# Patient Record
Sex: Female | Born: 1970 | Hispanic: Refuse to answer | Marital: Single | Smoking: Never smoker
Health system: Southern US, Community
[De-identification: ages and names within clinical notes are randomized; demographics above are authoritative.]

## PROBLEM LIST (undated history)

## (undated) DIAGNOSIS — Z853 Personal history of malignant neoplasm of breast: Secondary | ICD-10-CM

## (undated) HISTORY — PX: BREAST LUMPECTOMY WITH SENTINEL LYMPH NODE BIOPSY: SHX5597

## (undated) HISTORY — DX: Personal history of malignant neoplasm of breast: Z85.3

---

## 2019-05-02 ENCOUNTER — Other Ambulatory Visit: Payer: Self-pay | Admitting: Radiology

## 2019-05-03 ENCOUNTER — Telehealth: Payer: Self-pay | Admitting: Hematology and Oncology

## 2019-05-03 NOTE — Telephone Encounter (Signed)
Spoke with patient to confirm morning BC appointment on 1/6, packet will be emailed to patient

## 2019-05-08 ENCOUNTER — Other Ambulatory Visit: Payer: Self-pay | Admitting: *Deleted

## 2019-05-08 ENCOUNTER — Encounter: Payer: Self-pay | Admitting: *Deleted

## 2019-05-08 DIAGNOSIS — C50412 Malignant neoplasm of upper-outer quadrant of left female breast: Secondary | ICD-10-CM | POA: Insufficient documentation

## 2019-05-08 NOTE — Progress Notes (Signed)
[facility] CONSULT NOTE  Patient Care Team: Patient, No Pcp Per as PCP - General (General Practice) Erroll Luna, MD as Consulting Physician (General Surgery) Nicholas Lose, MD as Consulting Physician (Hematology and Oncology) Kyung Rudd, MD as Consulting Physician (Radiation Oncology) Mauro Kaufmann, RN as Oncology Nurse Navigator Rockwell Germany, RN as Oncology Nurse Navigator  CHIEF COMPLAINTS/PURPOSE OF CONSULTATION:  Newly diagnosed breast cancer  HISTORY OF PRESENTING ILLNESS:  Alexa Cook 49 y.o. female is here because of recent diagnosis of recurrent left breast cancer. She has a personal history of left breast cancer in 2015 for which she underwent a lumpectomy and treatment in [place]. She palpated a lump at the left breast lumpectomy site for several months. Diagnostic mammogram on 04/24/19 showed a 1.4cm mass at the lumpectomy site, a 0.6cm mass at the 6 o'clock position in the left breast, and one abnormal left axillary lymph node. She presents to the clinic today for initial evaluation and discussion of treatment options.   I reviewed her records extensively and collaborated the history with the patient.  SUMMARY OF ONCOLOGIC HISTORY: Oncology History  Malignant neoplasm of upper-outer quadrant of left female breast ([place])  2015 Initial Diagnosis   Left breast cancer s/p lumpectomy and treatment in [place] in 2015 (Unknown stage) ER/PR Pos, Received 14 days of XRT (stopped by her) Refused anti-estrogens.   04/2019 Relapse/Recurrence   Rash and nodule started Aug 2020, Imaging revealed fat necrosis and a mass extending to the skin , satellite nodule 0.6 cm, Enlarged LN 1.1 cm, Ultrasound 2.5 cm Biopsy revealed Grade 3 IDC, Prog panel: Pending     MEDICAL HISTORY:  Past Medical History:  Diagnosis Date  . HX: breast cancer     SURGICAL HISTORY: Past Surgical History:  Procedure Laterality Date  . BREAST LUMPECTOMY WITH SENTINEL  LYMPH NODE BIOPSY Left     SOCIAL HISTORY: Social History   Socioeconomic History  . Marital status: Unknown    Spouse name: Not on file  . Number of children: Not on file  . Years of education: Not on file  . Highest education level: Not on file  Occupational History  . Not on file  Tobacco Use  . Smoking status: Never Smoker  . Smokeless tobacco: Never Used  Substance and Sexual Activity  . Alcohol use: Not Currently  . Drug use: Never  . Sexual activity: Not on file  Other Topics Concern  . Not on file  Social History Narrative  . Not on file   Social Determinants of Health   Financial Resource Strain:   . Difficulty of Paying Living Expenses: Not on file  Food Insecurity:   . Worried About Charity fundraiser in the Last Year: Not on file  . Ran Out of Food in the Last Year: Not on file  Transportation Needs:   . Lack of Transportation (Medical): Not on file  . Lack of Transportation (Non-Medical): Not on file  Physical Activity:   . Days of Exercise per Week: Not on file  . Minutes of Exercise per Session: Not on file  Stress:   . Feeling of Stress : Not on file  Social Connections:   . Frequency of Communication with Friends and Family: Not on file  . Frequency of Social Gatherings with Friends and Family: Not on file  . Attends Religious Services: Not on file  . Active Member of Clubs or Organizations: Not on file  . Attends Archivist Meetings:  Not on file  . Marital Status: Not on file  Intimate Partner Violence:   . Fear of Current or Ex-Partner: Not on file  . Emotionally Abused: Not on file  . Physically Abused: Not on file  . Sexually Abused: Not on file    FAMILY HISTORY: Family History  Problem Relation Age of Onset  . Lung cancer Maternal Grandfather   . Cancer Paternal Grandmother        unk type    ALLERGIES:  has No Known Allergies.  MEDICATIONS:  Current Outpatient Medications  Medication Sig Dispense Refill  .  acyclovir (ZOVIRAX) 400 MG tablet Take 400 mg by mouth daily.     No current facility-administered medications for this visit.    REVIEW OF SYSTEMS:   Constitutional: Denies fevers, chills or abnormal night sweats Eyes: Denies blurriness of vision, double vision or watery eyes Ears, nose, mouth, throat, and face: Denies mucositis or sore throat Respiratory: Denies cough, dyspnea or wheezes Cardiovascular: Denies palpitation, chest discomfort or lower extremity swelling Gastrointestinal:  Denies nausea, heartburn or change in bowel habits Skin: Denies abnormal skin rashes Lymphatics: Denies new lymphadenopathy or easy bruising Neurological:Denies numbness, tingling or new weaknesses Behavioral/Psych: Mood is stable, no new changes  Breast: Denies any palpable lumps or discharge All other systems were reviewed with the patient and are negative.  PHYSICAL EXAMINATION: ECOG PERFORMANCE STATUS: 1 - Symptomatic but completely ambulatory  Vitals:   05/09/19 0913  BP: 128/65  Pulse: 65  Resp: 18  Temp: (!) 97.4 F (36.3 C)  SpO2: 100%   Filed Weights   05/09/19 0913  Weight: 154 lb 14.4 oz (70.3 kg)    GENERAL:alert, no distress and comfortable SKIN: skin color, texture, turgor are normal, no rashes or significant lesions EYES: normal, conjunctiva are pink and non-injected, sclera clear OROPHARYNX:no exudate, no erythema and lips, buccal mucosa, and tongue normal  NECK: supple, thyroid normal size, non-tender, without nodularity LYMPH:  no palpable lymphadenopathy in the cervical, axillary or inguinal LUNGS: clear to auscultation and percussion with normal breathing effort HEART: regular rate & rhythm and no murmurs and no lower extremity edema ABDOMEN:abdomen soft, non-tender and normal bowel sounds Musculoskeletal:no cyanosis of digits and no clubbing  PSYCH: alert & oriented x 3 with fluent speech NEURO: no focal motor/sensory deficits   LABORATORY DATA:  I have reviewed  the data as listed Lab Results  Component Value Date   WBC 5.2 05/09/2019   HGB 14.8 05/09/2019   HCT 43.8 05/09/2019   MCV 89.6 05/09/2019   PLT 287 05/09/2019   Lab Results  Component Value Date   NA 142 05/09/2019   K 4.2 05/09/2019   CL 105 05/09/2019   CO2 26 05/09/2019    RADIOGRAPHIC STUDIES: I have personally reviewed the radiological reports and agreed with the findings in the report.  ASSESSMENT AND PLAN:  Malignant neoplasm of upper-outer quadrant of left female breast [facility]) 2015: [place] Left breast cancer s/p lumpectomy and treatment in [place] in 2015 (Unknown stage) ER/PR Pos, Received 14 days of XRT (stopped by her) Refused anti-estrogens.  04/2019: Rash and nodule started Aug 2020, Imaging revealed fat necrosis and a mass extending to the skin , satellite nodule 0.6 cm, Enlarged LN 1.1 cm, Ultrasound 2.5 cm Biopsy revealed Grade 3 IDC, Prog panel: Pending  Emotional Distress: Patient has major depression and her main support had been her friend who died (suicide). Shes in severe depression because of this recurrence. She has major distrust for  medical personal based on her experience in [place].  She is currently on Xanax and also uses cannabis frequently.  Plan: 1. CT CAP 2. Patient may need Mastectomy and ALND but shes not completely agreeable to this yet. 3. Patient may need chemo but she doesn't even want to consider 4. If shes ER positive, She will need anti-estrogen therapy (She doesn't want to take that either)  I offered counseling and support and will see her post scan to discuss the pending results and scan results Dr. Brantley Stage will see the patient after the scans so that he can discuss surgical options for her.   All questions were answered. The patient knows to call the clinic with any problems, questions or concerns.   Rulon Eisenmenger, MD, MPH 05/09/2019    I, Molly Dorshimer, am acting as scribe for Nicholas Lose, MD.  I have reviewed  the above documentation for accuracy and completeness, and I agree with the above.

## 2019-05-09 ENCOUNTER — Ambulatory Visit: Payer: 59 | Attending: Surgery | Admitting: Physical Therapy

## 2019-05-09 ENCOUNTER — Other Ambulatory Visit: Payer: Self-pay | Admitting: *Deleted

## 2019-05-09 ENCOUNTER — Inpatient Hospital Stay: Payer: 59

## 2019-05-09 ENCOUNTER — Ambulatory Visit: Payer: 59 | Admitting: Licensed Clinical Social Worker

## 2019-05-09 ENCOUNTER — Encounter: Payer: Self-pay | Admitting: *Deleted

## 2019-05-09 ENCOUNTER — Inpatient Hospital Stay (HOSPITAL_BASED_OUTPATIENT_CLINIC_OR_DEPARTMENT_OTHER): Payer: 59 | Admitting: Hematology and Oncology

## 2019-05-09 ENCOUNTER — Encounter: Payer: Self-pay | Admitting: Licensed Clinical Social Worker

## 2019-05-09 ENCOUNTER — Other Ambulatory Visit: Payer: Self-pay

## 2019-05-09 ENCOUNTER — Encounter: Payer: Self-pay | Admitting: Hematology and Oncology

## 2019-05-09 ENCOUNTER — Encounter: Payer: Self-pay | Admitting: Physical Therapy

## 2019-05-09 ENCOUNTER — Telehealth: Payer: Self-pay | Admitting: *Deleted

## 2019-05-09 ENCOUNTER — Ambulatory Visit
Admission: RE | Admit: 2019-05-09 | Discharge: 2019-05-09 | Disposition: A | Discharge status: Home / Self Care | Payer: 59 | Source: Ambulatory Visit | Attending: Radiation Oncology | Admitting: Radiation Oncology

## 2019-05-09 DIAGNOSIS — R293 Abnormal posture: Secondary | ICD-10-CM | POA: Diagnosis present

## 2019-05-09 DIAGNOSIS — M25612 Stiffness of left shoulder, not elsewhere classified: Secondary | ICD-10-CM | POA: Diagnosis present

## 2019-05-09 DIAGNOSIS — Z853 Personal history of malignant neoplasm of breast: Secondary | ICD-10-CM

## 2019-05-09 DIAGNOSIS — Z809 Family history of malignant neoplasm, unspecified: Secondary | ICD-10-CM | POA: Insufficient documentation

## 2019-05-09 DIAGNOSIS — C50412 Malignant neoplasm of upper-outer quadrant of left female breast: Secondary | ICD-10-CM

## 2019-05-09 DIAGNOSIS — Z170 Estrogen receptor positive status [ER+]: Secondary | ICD-10-CM | POA: Insufficient documentation

## 2019-05-09 DIAGNOSIS — Z79899 Other long term (current) drug therapy: Secondary | ICD-10-CM | POA: Insufficient documentation

## 2019-05-09 DIAGNOSIS — Z801 Family history of malignant neoplasm of trachea, bronchus and lung: Secondary | ICD-10-CM | POA: Insufficient documentation

## 2019-05-09 DIAGNOSIS — F1290 Cannabis use, unspecified, uncomplicated: Secondary | ICD-10-CM | POA: Insufficient documentation

## 2019-05-09 DIAGNOSIS — F329 Major depressive disorder, single episode, unspecified: Secondary | ICD-10-CM

## 2019-05-09 LAB — CBC WITH DIFFERENTIAL (CANCER CENTER ONLY)
Abs Immature Granulocytes: 0.01 10*3/uL (ref 0.00–0.07)
Basophils Absolute: 0.1 10*3/uL (ref 0.0–0.1)
Basophils Relative: 1 %
Eosinophils Absolute: 0.5 10*3/uL (ref 0.0–0.5)
Eosinophils Relative: 10 %
HCT: 43.8 % (ref 36.0–46.0)
Hemoglobin: 14.8 g/dL (ref 12.0–15.0)
Immature Granulocytes: 0 %
Lymphocytes Relative: 27 %
Lymphs Abs: 1.4 10*3/uL (ref 0.7–4.0)
MCH: 30.3 pg (ref 26.0–34.0)
MCHC: 33.8 g/dL (ref 30.0–36.0)
MCV: 89.6 fL (ref 80.0–100.0)
Monocytes Absolute: 0.4 10*3/uL (ref 0.1–1.0)
Monocytes Relative: 8 %
Neutro Abs: 2.8 10*3/uL (ref 1.7–7.7)
Neutrophils Relative %: 54 %
Platelet Count: 287 10*3/uL (ref 150–400)
RBC: 4.89 MIL/uL (ref 3.87–5.11)
RDW: 12.5 % (ref 11.5–15.5)
WBC Count: 5.2 10*3/uL (ref 4.0–10.5)
nRBC: 0 % (ref 0.0–0.2)

## 2019-05-09 LAB — CMP (CANCER CENTER ONLY)
ALT: 14 U/L (ref 0–44)
AST: 14 U/L — ABNORMAL LOW (ref 15–41)
Albumin: 4.4 g/dL (ref 3.5–5.0)
Alkaline Phosphatase: 53 U/L (ref 38–126)
Anion gap: 11 (ref 5–15)
BUN: 11 mg/dL (ref 6–20)
CO2: 26 mmol/L (ref 22–32)
Calcium: 9.1 mg/dL (ref 8.9–10.3)
Chloride: 105 mmol/L (ref 98–111)
Creatinine: 0.71 mg/dL (ref 0.44–1.00)
GFR, Est AFR Am: >60 mL/min (ref >60)
GFR, Estimated: >60 mL/min (ref >60)
Glucose, Bld: 109 mg/dL — ABNORMAL HIGH (ref 70–99)
Potassium: 4.2 mmol/L (ref 3.5–5.1)
Sodium: 142 mmol/L (ref 135–145)
Total Bilirubin: 0.5 mg/dL (ref 0.3–1.2)
Total Protein: 7.6 g/dL (ref 6.5–8.1)

## 2019-05-09 LAB — GENETIC SCREENING ORDER

## 2019-05-09 NOTE — Telephone Encounter (Signed)
Called pt and discussed ER/PR+, Ki67 and Her2 status. Informed pt that I was able to move CT scan up to 1/8 at 10:15am at [facility]. Received verbal understanding. Denies further needs or questions at this time.

## 2019-05-09 NOTE — Progress Notes (Signed)
REFERRING PROVIDER: Nicholas Lose, MD [place],  [place] [address]  PRIMARY PROVIDER:  Patient, No Pcp Per  I connected with Ms. Reaver on 05/09/2019 at 11:30 AM EDT by Webex and verified that I am speaking with the correct person using two identifiers.    Patient location: [facility] Provider location: office  HISTORY OF PRESENT ILLNESS:   Ms. Massar, a 49 y.o. female, was seen for a [place] cancer genetics consultation at the request of Dr. Lindi Adie due to a personal history of cancer.  Ms. Mcmillen presents to clinic today to discuss the possibility of a hereditary predisposition to cancer, genetic testing, and to further clarify her future cancer risks, as well as potential cancer risks for family members.   In 2015, at  the age of 41, Ms. Dimascio was diagnosed with left breast cancer. This was treated with lumpectomy and radiation.  In 2020, at the age of 81, Ms. 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 The treatment plan includes surgery, chemotherapy and radiation.  CANCER HISTORY:  Oncology History  Malignant neoplasm of upper-outer quadrant of left female breast ([place])  2015 Initial Diagnosis   Left breast cancer s/p lumpectomy and treatment in [place] in 2015      Past Medical History:  Diagnosis Date  . HX: breast cancer     Past Surgical History:  Procedure Laterality Date  . BREAST LUMPECTOMY WITH SENTINEL LYMPH NODE BIOPSY Left     Social History   Socioeconomic History  . Marital status: Unknown    Spouse name: Not on file  . Number of children: Not on file  . Years of education: Not on file  . Highest education level: Not on file  Occupational History  . Not on file  Tobacco Use  . Smoking status: Never Smoker  . Smokeless tobacco: Never Used  Substance and Sexual Activity  . Alcohol use: Not Currently  . Drug use: Never  . Sexual activity: Not on file  Other Topics  Concern  . Not on file  Social History Narrative  . Not on file   Social Determinants of Health   Financial Resource Strain:   . Difficulty of Paying Living Expenses: Not on file  Food Insecurity:   . Worried About Charity fundraiser in the Last Year: Not on file  . Ran Out of Food in the Last Year: Not on file  Transportation Needs:   . Lack of Transportation (Medical): Not on file  . Lack of Transportation (Non-Medical): Not on file  Physical Activity:   . Days of Exercise per Week: Not on file  . Minutes of Exercise per Session: Not on file  Stress:   . Feeling of Stress : Not on file  Social Connections:   . Frequency of Communication with Friends and Family: Not on file  . Frequency of Social Gatherings with Friends and Family: Not on file  . Attends Religious Services: Not on file  . Active Member of Clubs or Organizations: Not on file  . Attends Archivist Meetings: Not on file  . Marital Status: Not on file     FAMILY HISTORY:  We obtained a detailed, 4-generation family history.  Significant diagnoses are listed below: Family History  Problem Relation Age of Onset  . Lung cancer Maternal Grandfather   . Cancer Paternal Grandmother        unk type   Ms. Faeth has 2 sons, no  cancers. She does not have brothers or sisters.  Ms. Jenning mother has no history of cancer. Patient has 1 maternal aunt and 3 maternal uncles, no cancers. Patient is unsure if any maternal cousins have had cancer. Maternal grandfather had lung cancer, maternal grandmother had no history of cancer.  Ms. Schlemmer father has no history of cancer. No cancer history for her 4 paternal aunts/uncles, and no known cancers in paternal cousins. Paternal grandmother had cancer but patient is unsure the type. Paternal grandfather did not have cancer.  Ms. Sawdey is unaware of previous family history of genetic testing for hereditary cancer risks. There is no reported Ashkenazi  Jewish ancestry. There is no known consanguinity.  GENETIC COUNSELING ASSESSMENT: Ms. Dresden is a 49 y.o. female with a personal history which is somewhat suggestive of a hereditary cancer syndrome and predisposition to cancer. We, therefore, discussed and recommended the following at today's visit.   DISCUSSION: We discussed that 5 - 10% of breast cancer is hereditary, with most cases associated with BRCA1/BRCA2 mutations.  There are other genes that can be associated with hereditary breast cancer syndromes.  We discussed that testing is beneficial for several reasons including surgical decision-making for breast cancer, knowing how to follow individuals after completing their treatment, and understand if other family members could be at risk for cancer and allow them to undergo genetic testing.   We reviewed the characteristics, features and inheritance patterns of hereditary cancer syndromes. We also discussed genetic testing, including the appropriate family members to test, the process of testing, insurance coverage and turn-around-time for results. We discussed the implications of a negative, positive and/or variant of uncertain significant result. In order to get genetic test results in a timely manner so that Ms. Canion can use these genetic test results for surgical decisions, we recommended Ms. Walla pursue genetic testing for the Breast Cancer STAT Panel. Once complete, we recommend Ms. Witt pursue reflex genetic testing to the Common Hereditary Cancers  gene panel.   The STAT Breast cancer panel offered by [facility] includes sequencing and rearrangement analysis for the following 9 genes:  ATM, BRCA1, BRCA2, CDH1, CHEK2, PALB2, PTEN, STK11 and TP53.    The Common Hereditary Cancers Panel offered by [facility] includes sequencing and/or deletion duplication testing of the following 48 genes: APC, ATM, AXIN2, BARD1, BMPR1A, BRCA1, BRCA2, BRIP1, CDH1, CDKN2A (p14ARF), CDKN2A (p16INK4a),  CKD4, CHEK2, CTNNA1, DICER1, EPCAM (Deletion/duplication testing only), GREM1 (promoter region deletion/duplication testing only), KIT, MEN1, MLH1, MSH2, MSH3, MSH6, MUTYH, NBN, NF1, NHTL1, PALB2, PDGFRA, PMS2, POLD1, POLE, PTEN, RAD50, RAD51C, RAD51D, RNF43, SDHB, SDHC, SDHD, SMAD4, SMARCA4. STK11, TP53, TSC1, TSC2, and VHL.  The following genes were evaluated for sequence changes only: SDHA and HOXB13 c.251G>A variant only.  Based on Ms. Ramaswamy's personal history of cancer, she meets medical criteria for genetic testing. Despite that she meets criteria, she may still have an out of pocket cost.   PLAN: After considering the risks, benefits, and limitations, Ms. Disbrow provided informed consent to pursue genetic testing and the blood sample was sent to [facility] for analysis of the Breast Cancer STAT Panel + Common Hereditary Cancers Panel. Results should be available within approximately 1 weeks' time, at which point they will be disclosed by telephone to Ms. Hardie, as will any additional recommendations warranted by these results. Ms. Wismer will receive a summary of her genetic counseling visit and a copy of her results once available. This information will also be available in Epic.   Ms.  Berkemeier's questions were answered to her satisfaction today. Our contact information was provided should additional questions or concerns arise. Thank you for the referral and allowing [place] to share in the care of your patient.   Faith Rogue, MS, [facility] Genetic Counselor [email or web address] Phone: [phone]  The patient was seen for a total of 12 minutes in virtual  genetic counseling. Patient's mother was also present.  Drs. Magrinat, Lindi Adie and/or Burr Medico were available for discussion regarding this case.   _______________________________________________________________________ For Office Staff:  Number of people involved in session: 2 Was an Intern/ student involved with  case: no

## 2019-05-09 NOTE — Assessment & Plan Note (Signed)
2015: [place] Left breast cancer s/p lumpectomy and treatment in [place] in 2015 (Unknown stage) ER/PR Pos, Received 14 days of XRT (stopped by her) Refused anti-estrogens.  04/2019: Rash and nodule started Aug 2020, Imaging revealed fat necrosis and a mass extending to the skin , satellite nodule 0.6 cm, Enlarged LN 1.1 cm, Ultrasound 2.5 cm Biopsy revealed Grade 3 IDC, Prog panel: Pending  Emotional Distress: Patient has major depression and her main support had been her friend who died (suicide). Shes in severe depression because of this recurrence. She has major distrust for medical personal.  Plan: 1. CT CAP 2. Patient may need Mastectomy and ALND but shes not completely agreeable to this 3. Patient may need chemo but she doesn't even want to consider 4. If shes ER positive, She will ne anti-estrogen therapy (She doesn't want to take that either)  I offered counseling and support and will see her post scan to discuss the pending results and scan results

## 2019-05-09 NOTE — Addendum Note (Signed)
Addended by: Arbutus Ped C on: 05/09/2019 11:42 AM   Modules accepted: Orders

## 2019-05-09 NOTE — Progress Notes (Signed)
Clinical Social Work [facility] Psychosocial Distress Screening [place]  Patient completed distress screening protocol and scored a 10 on the Psychosocial Distress Thermometer which indicates severe distress. Clinical Social Worker met with patient and patients family member in [facility] to assess for distress and other psychosocial needs. Patient stated she was feeling overwhelmed and frustrated due to lack of information on her treatment plan. CSW and patient discussed common feeling and emotions when being diagnosed with cancer, and the importance of support during treatment. CSW informed patient of the support team and support services at [facility].  Patient was appreciative of information, but was not interested in exploring additional support at this time.  Patient was agreeable to CSW follow up at a later date. CSW provided contact information and encouraged patient to call with any questions or concerns.  ONCBCN DISTRESS SCREENING 05/09/2019  Screening Type Initial Screening  Distress experienced in past week (1-10) 10  Practical problem type Housing;Insurance;Work/school  Emotional problem type Depression;Nervousness/Anxiety;Adjusting to illness;Isolation/feeling alone;Feeling hopeless;Boredom;Adjusting to appearance changes  Spiritual/Religous concerns type Loss of Faith;Facing my mortality;Loss of sense of purpose  Information Concerns Type Lack of info about diagnosis;Lack of info about treatment  Physical Problem type Pain;Sleep/insomnia;Loss of appetitie;Skin dry/itchy  Physician notified of physical symptoms Yes     Johnnye Lana, MSW, LCSW, OSW-C Clinical Social Worker [facility] [phone]

## 2019-05-09 NOTE — Patient Instructions (Signed)

## 2019-05-09 NOTE — Therapy (Signed)
[place], [place], [address] Phone: [phone]   Fax:  [phone]  Physical Therapy Evaluation  Patient Details  Name: Alexa Cook MRN: [id number] Date of Birth: Nov 08, 1970 Referring Provider (PT): Dr. Erroll Luna   Encounter Date: 05/09/2019  PT End of Session - 05/09/19 1058    Visit Number  1    Number of Visits  2    Date for PT Re-Evaluation  07/04/19    PT Start Time  0912    PT Stop Time  0938    PT Time Calculation (min)  26 min    Activity Tolerance  Patient tolerated treatment well    Behavior During Therapy  [facility] for tasks assessed/performed       Past Medical History:  Diagnosis Date  . HX: breast cancer     Past Surgical History:  Procedure Laterality Date  . BREAST LUMPECTOMY WITH SENTINEL LYMPH NODE BIOPSY Left     There were no vitals filed for this visit.   Subjective Assessment - 05/09/19 0954    Subjective  Patient reports she is here today to be seen by her medical team for her newly diagnosed left breast cancer.    Patient is accompained by:  Family member    Pertinent History  Patient was diagnosed on 04/24/2019 with left grade III invasive ductal carcinoma breast cancer. There is a 6 mm mass on her skin and a 2.5 cm mass in her upper outer quadrant. She has a known positive axillary lymph node. In 2015, she underwent a left lumpectomy and radiation for invasive ductal carcinoma. She reports she stopped radiation after 14 treatments and declined anti-estrogen therapy. She reports she did not have any axillary nodes removed in 2015 but we do not have her medical record from [place].    Patient Stated Goals  Reduce lymphedema risk and learn post op shoulder ROM HEP    Currently in Pain?  Yes    Pain Score  7     Pain Location  Neck    Pain Orientation  Right;Left    Pain Descriptors / Indicators  Aching;Numbness    Pain Type  Chronic pain    Pain Radiating  Towards  BUE    Pain Onset  More than a month ago    Pain Frequency  Intermittent    Aggravating Factors   unknown    Pain Relieving Factors  unknown    Multiple Pain Sites  No         [facility] PT Assessment - 05/09/19 0001      Assessment   Medical Diagnosis  Left breast cancer    Referring Provider (PT)  Dr. Marcello Moores [place]    Onset Date/Surgical Date  04/24/19    Hand Dominance  Right    Prior Therapy  none      Precautions   Precautions  Other (comment)    Precaution Comments  active cancer; previous left breast cancer      Restrictions   Weight Bearing Restrictions  No      Balance Screen   Has the patient fallen in the past 6 months  No    Has the patient had a decrease in activity level because of a fear of falling?   No    Is the patient reluctant to leave their home because of a fear of falling?   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[employer]; very physical work    Leisure  She does not exercise      Cognition   Overall Cognitive Status  Within Functional Limits for tasks assessed      Posture/Postural Control   Posture/Postural Control  Postural limitations    Postural Limitations  Rounded Shoulders;Forward head      ROM / Strength   AROM / PROM / Strength  AROM;Strength      AROM   Overall AROM Comments  Cervical AROM is WNL    AROM Assessment Site  Shoulder;Cervical    Right/Left Shoulder  Right;Left    Right Shoulder Extension  38 Degrees    Right Shoulder Flexion  145 Degrees    Right Shoulder ABduction  157 Degrees    Right Shoulder Internal Rotation  62 Degrees    Right Shoulder External Rotation  75 Degrees    Left Shoulder Extension  40 Degrees    Left Shoulder Flexion  122 Degrees    Left  Shoulder ABduction  118 Degrees   painful in shoulder   Left Shoulder Internal Rotation  63 Degrees    Left Shoulder External Rotation  75 Degrees      Strength   Overall Strength  Within functional limits for tasks performed        LYMPHEDEMA/ONCOLOGY QUESTIONNAIRE - 05/09/19 1031      Type   Cancer Type  Left breast cancer      Lymphedema Assessments   Lymphedema Assessments  Upper extremities      Right Upper Extremity Lymphedema   10 cm Proximal to Olecranon Process  27.5 cm    Olecranon Process  25 cm    10 cm Proximal to Ulnar Styloid Process  21.1 cm    Just Proximal to Ulnar Styloid Process  14.7 cm    Across Hand at [employer]  17.7 cm    At [place] of 2nd Digit  6.2 cm      Left Upper Extremity Lymphedema   10 cm Proximal to Olecranon Process  27.6 cm    Olecranon Process  24 cm    10 cm Proximal to Ulnar Styloid Process  20.3 cm    Just Proximal to Ulnar Styloid Process  14.9 cm    Across Hand at [employer]  18.4 cm    At [place] of 2nd Digit  6.3 cm          Quick Dash - 05/09/19 0001    Open a tight or new jar  --    Do heavy household chores (wash walls, wash floors)  --    Carry a shopping bag or briefcase  --    Wash your back  --    Use a knife to cut food  --    Recreational activities in which you take some force or impact through your arm, shoulder, or hand (golf, hammering, tennis)  --    During the past week, to what extent has your arm, shoulder or hand problem interfered with your normal social activities with family, friends, neighbors, or groups?  --    During the past week, to what extent has your arm, shoulder or hand problem limited your work or  other regular daily activities  --    Arm, shoulder, or hand pain.  --    Tingling (pins and needles) in your arm, shoulder, or hand  --    Difficulty Sleeping  --    DASH Score  --        Objective measurements completed on examination: See above findings.       Patient was  instructed today in a home exercise program today for post op shoulder range of motion. These included active assist shoulder flexion in sitting, scapular retraction, wall walking with shoulder abduction, and hands behind head external rotation.  She was encouraged to do these twice a day, holding 3 seconds and repeating 5 times when permitted by her physician.         PT Education - 05/09/19 1032    Education Details  Lymphedema risk reduction and post op shoulder ROM HEP    Person(s) Educated  Patient    Methods  Explanation;Demonstration;Handout    Comprehension  Returned demonstration;Verbalized understanding          PT Long Term Goals - 05/09/19 1106      PT LONG TERM GOAL #1   Title  Patient will demonstrate she has regained full shoulder ROM and function post operatively compared to baselines.    Time  8    Period  Weeks    Target Date  07/04/19      Breast Clinic Goals - 05/09/19 1106      Patient will be able to verbalize understanding of pertinent lymphedema risk reduction practices relevant to her diagnosis specifically related to skin care.   Time  1    Period  Days    Status  Achieved      Patient will be able to return demonstrate and/or verbalize understanding of the post-op home exercise program related to regaining shoulder range of motion.   Time  1    Period  Days    Status  Achieved      Patient will be able to verbalize understanding of the importance of attending the postoperative After Breast Cancer Class for further lymphedema risk reduction education and therapeutic exercise.   Time  1    Period  Days    Status  Achieved            Plan - 05/09/19 1059    Clinical Impression Statement  Patient was diagnosed on 04/24/2019 with left grade III invasive ductal carcinoma breast cancer. There is a 6 mm mass on her skin and a 2.5 cm mass in her upper outer quadrant. She has a known positive axillary lymph node. In 2015, she underwent a left  lumpectomy and radiation for invasive ductal carcinoma. She reports she stopped radiation after 14 treatments and declined anti-estrogen therapy. She reports she did not have any axillary nodes removed in 2015 but we do not have her medical record from [place] yet. Her multidisciplinary medical team met prior to her assessments to determine a recommended plan. The recommendation was a left mastectomy and either sentinel node biopsy or axillary node dissection depending on previous surgery report followed by possible chemotherapy and possible anti-estrogen therapy pending her progrnositc panel results. She reported that she will possibly agree to surgery but will likely decline chemo, radiation, and anti-estrogen therapy. She will benefit from a post op PT reassessment to determine needs.    Stability/Clinical Decision Making  Stable/Uncomplicated    Clinical Decision Making  Low    Rehab Potential  Good   May decline f/u care from PT   PT Treatment/Interventions  ADLs/Self Care Home Management;Therapeutic exercise;Patient/family education    PT Next Visit Plan  Will reassess 3-4 weeks post op if agreeable to f/u care from PT    PT Home Exercise Plan  Post op shoulder ROM HEP    Consulted and Agree with Plan of Care  Patient;Family member/caregiver    Family Member Consulted  Mom       Patient will benefit from skilled therapeutic intervention in order to improve the following deficits and impairments:  Postural dysfunction, Decreased range of motion, Decreased knowledge of precautions, Impaired UE functional use, Pain  Visit Diagnosis: Carcinoma of upper-outer quadrant of left female breast, unspecified estrogen receptor status ([place]) - Plan: PT plan of care cert/re-cert  Abnormal posture - Plan: PT plan of care cert/re-cert  Stiffness of left shoulder, not elsewhere classified - Plan: PT plan of care cert/re-cert   Patient will follow up at outpatient cancer rehab 3-4 weeks following  surgery.  If the patient requires physical therapy at that time, a specific plan will be dictated and sent to the referring physician for approval. The patient was educated today on appropriate basic range of motion exercises to begin post operatively and the importance of attending the After Breast Cancer class following surgery.  Patient was educated today on lymphedema risk reduction practices as it pertains to recommendations that will benefit the patient immediately following surgery.  She verbalized good understanding.      Problem List Patient Active Problem List   Diagnosis Date Noted  . Malignant neoplasm of upper-outer quadrant of left female breast [facility]) 05/08/2019   Annia Friendly, PT 05/09/19 11:23 AM  [place], [place], [address] Phone: [phone]   Fax:  [phone]  Name: Alexa Cook MRN: [id number] Date of Birth: 1970/07/14

## 2019-05-09 NOTE — Progress Notes (Signed)
Radiation Oncology         [phone] ________________________________  Name: Alexa Cook        MRN: [id number]  Date of Service: 05/09/2019 DOB: May 27, 1970  OV:FIEPPIR, No Pcp Per  Erroll Luna, MD     REFERRING PHYSICIAN: Erroll Luna, MD   DIAGNOSIS: The encounter diagnosis was Malignant neoplasm of upper-outer quadrant of left female breast, unspecified estrogen receptor status ([place]).   HISTORY OF PRESENT ILLNESS: Alexa Cook is a 49 y.o. female seen in the multidisciplinary breast clinic for a history of left-sided breast cancer.  The patient was originally diagnosed in 63 when she was living in [place].  It appears that she had a ER/PR positive disease, HER-2/neu was negative, she received a lumpectomy followed by a partial course of radiotherapy.  She states that she declined antiestrogen therapy, and also received 14 fractions of radiotherapy.  She states that she discontinued radiation as she felt like it was not good for her body, she states that she did not have severe side effects however.  She reports that she started noticing a palpable mass along the area of prior lumpectomy and proceeded to evaluation for this.  Diagnostic imaging revealed a 1.4 cm mass involving the skin at 2:00, there was a secondary site of calcification along the prior surgical site around 3:00, and concerns for adenopathy in the left axilla measuring 1.1 cm.  She underwent a biopsy on 04/24/19, which revealed an invasive ductal carcinoma grade 3, her node was involved with carcinoma though no nodal tissue was present.  Her prognostic indicators are pending.  She is seen today to discuss options of treatment for what is most likely a recurrence of her prior breast cancer.    PREVIOUS RADIATION THERAPY: Yes   2015: The patient received 14 fractions of radiation in the [place] area in [place].  She did not complete the course that was prescribed  PAST MEDICAL HISTORY:  Past  Medical History:  Diagnosis Date  . HX: breast cancer        PAST SURGICAL HISTORY: Past Surgical History:  Procedure Laterality Date  . BREAST LUMPECTOMY WITH SENTINEL LYMPH NODE BIOPSY Left      FAMILY HISTORY:  Family History  Problem Relation Age of Onset  . Lung cancer Maternal Grandfather   . Cancer Paternal Grandmother        unk type     SOCIAL HISTORY:  reports that she has never smoked. She has never used smokeless tobacco. She reports previous alcohol use. She reports that she does not use drugs. The patient relocated to [facility] from [place] last year. She lives in [place].  Her mother accompanies her today and is quite tearful.   ALLERGIES: Patient has no known allergies.   MEDICATIONS:  Current Outpatient Medications  Medication Sig Dispense Refill  . acyclovir (ZOVIRAX) 400 MG tablet Take 400 mg by mouth daily.     No current facility-administered medications for this encounter.     REVIEW OF SYSTEMS: On review of systems, the patient reports that she is doing okay overall.  No complaints are specifically verbalized.     PHYSICAL EXAM:  Wt Readings from Last 3 Encounters:  05/09/19 154 lb 14.4 oz (70.3 kg)   Temp Readings from Last 3 Encounters:  05/09/19 (!) 97.4 F (36.3 C)   BP Readings from Last 3 Encounters:  05/09/19 128/65   Pulse Readings from Last 3 Encounters:  05/09/19 65    In general this  is a well appearing Caucasian female in no acute distress.  She's alert and oriented x4 and appropriate throughout the examination. Cardiopulmonary assessment is negative for acute distress and she exhibits normal effort. Bilateral breast exam is deferred.    ECOG = 1  0 - Asymptomatic (Fully active, able to carry on all predisease activities without restriction)  1 - Symptomatic but completely ambulatory (Restricted in physically strenuous activity but ambulatory and able to carry out work of a light or sedentary nature. For example, light  housework, office work)  2 - Symptomatic, <50% in bed during the day (Ambulatory and capable of all self care but unable to carry out any work activities. Up and about more than 50% of waking hours)  3 - Symptomatic, >50% in bed, but not bedbound (Capable of only limited self-care, confined to bed or chair 50% or more of waking hours)  4 - Bedbound (Completely disabled. Cannot carry on any self-care. Totally confined to bed or chair)  5 - Death   Eustace Pen MM, Creech RH, Tormey DC, et al. [phone]). "Toxicity and response criteria of the [facility]". [place] Oncol. 5 (6): 649-55    LABORATORY DATA:  Lab Results  Component Value Date   WBC 5.2 05/09/2019   HGB 14.8 05/09/2019   HCT 43.8 05/09/2019   MCV 89.6 05/09/2019   PLT 287 05/09/2019   Lab Results  Component Value Date   NA 142 05/09/2019   K 4.2 05/09/2019   CL 105 05/09/2019   CO2 26 05/09/2019   Lab Results  Component Value Date   ALT 14 05/09/2019   AST 14 (L) 05/09/2019   ALKPHOS 53 05/09/2019   BILITOT 0.5 05/09/2019      RADIOGRAPHY: No results found.     IMPRESSION/PLAN: 1. Probable recurrence of ER/PR positive invasive ductal carcinoma of the left breast.  I spent time discussing the patient's case with her and her mother.  She declined to speak further with Dr. Lisbeth Renshaw.  I discussed our conversation in breast oncology conference this morning and that the recommendation discussed would be to proceed with staging CT scans followed by mastectomy, likely axillary dissection, and probable systemic chemotherapy.  At this time she states that she is adamantly against proceeding with any form of chemotherapy, would only be interested in lumpectomy surgery, and is not interested in any further radiotherapy.  She does have valid concerns about why she is not interested in radiotherapy out of concern for risks of secondary malignancy, risks to the heart and lungs, and we discussed these.  I outlined  that I felt like the risk of encountering the sequela would be much lower than the benefit of radiotherapy and that the risk of recurrence which we believe she is already experiencing could progress into a very difficult disease to treat.  She is in agreement and politely declines any further conversation.  I let her know that she had any questions or concerns or wanted to meet back in the presence of Dr. Lisbeth Renshaw we would be happy to coordinate such a visit.  We will follow along with her course as it is discussed in conference and would be happy to see her in the future. 2. Possible genetic predisposition to malignancy. The patient is a candidate for genetic testing given her personal and  history.  She believes that she is already been tested for BRCA but was willing to consider a discussion with genetics to make sure her testing is  updated.  She will be seen today.   In a visit lasting 30 minutes, greater than 50% of the time was spent face to face discussing her case, and coordinating the patient's care.     Carola Rhine, PAC

## 2019-05-10 ENCOUNTER — Telehealth: Payer: Self-pay | Admitting: General Practice

## 2019-05-10 NOTE — Telephone Encounter (Signed)
[facility] CSW Progress Notes   

## 2019-05-10 NOTE — Telephone Encounter (Signed)
[facility] CSW Progress Notes  Called patient to check in at request of nurse navigator.  Called patient, was able to review some of her concerns.  She gets her last paycheck today - she is concerned about her finances as she will not be able to work during treatment.  Is also quite anxious about her status re cancer - this is her second experience of being diagnosed w cancer.  Wants to know clearly the stage of her cancer, treatment plan and be able to discuss options with providers.  Was seen in [facility] [place] yesterday but felt providers were not able to fully discuss her options as she needed an additional scan (now scheduled for this week).  Worried about costs of scans, visits, treatment.  Does have insurance but is responsible for copays, deductibles and similar. Without income, she will struggle to pay her part for treatment.  Currently living with her mother who is a support to her; however, has recently purchased a home and hopes to move in at some point.  Has adult children, one lives in [place] and cannot travel due to [place].  Has struggled with depression off/on at different times in her life, no current mental health treatment.  Current situation with uncertainty regarding current diagnosis of cancer and uncertainty re treatment plan has heightened her anxiety.  She also lost a friend to suicide this past year.    Would like help from CSW to get more emotional support and also obtain any available financial assistance to help meet daily expenses.  Referred to [facility] Counseling Intern (can find other referrals through her insurance network; however, these will incur a copay).  Also provided her with information on several outside sources of financial assistance.  Once her treatment plan is established she can also apply for the [place].  Messaged nurse navigator with an update - patient would like information on her scan results as soon as possible.  Also provided information on [place] support programs -  patient states she prefers one on one support vs groups.  Edwyna Shell, LCSW Clinical Social Worker Phone:  [phone] Cell:  [phone]

## 2019-05-11 ENCOUNTER — Other Ambulatory Visit: Payer: Self-pay

## 2019-05-11 ENCOUNTER — Ambulatory Visit (HOSPITAL_COMMUNITY)
Admission: RE | Admit: 2019-05-11 | Discharge: 2019-05-11 | Disposition: A | Discharge status: Home / Self Care | Payer: 59 | Source: Ambulatory Visit | Attending: Hematology and Oncology | Admitting: Hematology and Oncology

## 2019-05-11 DIAGNOSIS — C50412 Malignant neoplasm of upper-outer quadrant of left female breast: Secondary | ICD-10-CM | POA: Insufficient documentation

## 2019-05-11 MED ORDER — SODIUM CHLORIDE (PF) 0.9 % IJ SOLN
INTRAMUSCULAR | Status: AC
  Filled 2019-05-11: qty 50

## 2019-05-11 MED ORDER — IOHEXOL 300 MG/ML  SOLN
100.0000 mL | Freq: Once | INTRAMUSCULAR | Status: AC | PRN
  Administered 2019-05-11: 100 mL via INTRAVENOUS

## 2019-05-11 NOTE — Progress Notes (Signed)
Left patient voicemail on 05/11/19 to inform the pt about available support services at [facility]. Patient encouraged to reach out to the counseling intern by phone to schedule a one-on-one counseling intake call. Counseling intern will follow-up with the patient next week.  Verlan Friends [place] Counseling Intern Voicemail: [phone]

## 2019-05-14 ENCOUNTER — Other Ambulatory Visit: Payer: Self-pay | Admitting: *Deleted

## 2019-05-14 ENCOUNTER — Encounter: Payer: Self-pay | Admitting: *Deleted

## 2019-05-14 DIAGNOSIS — C50412 Malignant neoplasm of upper-outer quadrant of left female breast: Secondary | ICD-10-CM

## 2019-05-15 ENCOUNTER — Telehealth: Payer: Self-pay | Admitting: Hematology and Oncology

## 2019-05-15 ENCOUNTER — Ambulatory Visit (HOSPITAL_COMMUNITY): Payer: 59

## 2019-05-15 NOTE — Telephone Encounter (Signed)
Faxed records to [employer] [phone]

## 2019-05-16 ENCOUNTER — Telehealth: Payer: Self-pay

## 2019-05-16 NOTE — Telephone Encounter (Signed)
Nutrition Assessment  Reason for Assessment:  Pt attended Breast Clinic on 05/09/19 and was given nutrition packet of information by nurse navigator.    ASSESSMENT: 49 year old female with recurrent left breast cancer.  Noted treated in [place] in 2015.    Patient returned RD's call.  Spoke with patient via phone to introduce self and service at [facility].  Patient reports appetite is not that good.    Medications:  reviewed  Labs: reviewed  Anthropometrics:   Height: 67 inches Weight: 154 lb BMI: 24   NUTRITION DIAGNOSIS: Food and nutrition related knowledge deficit related to new diagnosis of breast cancer as evidenced by no prior need for nutrition related information.  INTERVENTION:   Discussed briefly packet of information regarding nutritional tips for breast cancer patients. No questions at this time.  Contact information provided and patient knows to contact me with questions/concerns.    MONITORING, EVALUATION, and GOAL: Pt will consume a healthy plant based diet to maintain lean body mass throughout treatment.   Shylo Dillenbeck B. Zenia Resides, [place], [place] Registered Dietitian [phone] (pager)

## 2019-05-16 NOTE — Telephone Encounter (Signed)
Nutrition  Patient identified as attending Breast Clinic on 05/09/2019.    Chart reviewed.   Called to introduce self and service at [facility].  No answer.  Left message with contact information.   Suraj Ramdass B. Zenia Resides, [place], [place] Registered Dietitian [phone] (pager)

## 2019-05-17 ENCOUNTER — Telehealth: Payer: Self-pay | Admitting: Licensed Clinical Social Worker

## 2019-05-17 NOTE — Progress Notes (Unsigned)
Spoke to patient on 05/17/19 for an intake counseling appointment via phone. Pt stated she feels that her recurrence of cancer has caused her to feel hopeless and and that she has little faith in the medical system. Counseling intern assessed pt for SI and determined that she is at moderate risk for suicide. Pt reported thoughts of suicide on a weekly and at times daily basis for the past five years, but reported she has no means or plans to commit suicide. Counseling intern identified the pt's risk factors including recurrence of cancer, financial problems, recent loss of a friend to suicide, and a past suicide attempt (five years ago). Patient has several protective factors which include her strong relationship with her son in [place] (they speak every day), living with her mother, willingness to engage in counseling, and a fear of dying due to pain and suffering. Counseling intern also assessed that the pt is experiencing depression as evidenced by her inability to experience joy or pleasure, her extreme fatigue, her lack of appetite, and her feelings of hopelessness. Pt stated that she plans to attend her next appointment with her surgeon on 05/21/19 and does plan to go through with her surgery as planned. However, pt stated she has refused chemo and is worried about how the surgery will disfigure her body. Counseling intern and pt discussed the use of a safety plan for use when pt has thoughts of harming herself. Pt requested that the counseling intern call her after her appointment on 05/21/19 to check-in and provide space for her to work through her emotions at that time.    Next Counseling Appt: Monday, May 21, 2019 at 2:00 via phone  [facility] Counseling Intern Voicemail:  [phone]

## 2019-05-17 NOTE — Telephone Encounter (Signed)
Received notification from [name] (genetic testing lab) that Alexa Cook has cancelled her genetic testing. Left a voicemail for [name] to confirm she would like to cancel this, and also to see if she has her old genetic test report available for review.

## 2019-05-21 ENCOUNTER — Ambulatory Visit (HOSPITAL_COMMUNITY)
Admission: RE | Admit: 2019-05-21 | Discharge: 2019-05-21 | Disposition: A | Discharge status: Home / Self Care | Payer: 59 | Source: Ambulatory Visit | Attending: Hematology and Oncology | Admitting: Hematology and Oncology

## 2019-05-21 ENCOUNTER — Other Ambulatory Visit: Payer: Self-pay

## 2019-05-21 DIAGNOSIS — C50412 Malignant neoplasm of upper-outer quadrant of left female breast: Secondary | ICD-10-CM

## 2019-05-21 MED ORDER — GADOBUTROL 1 MMOL/ML IV SOLN
7.0000 mL | Freq: Once | INTRAVENOUS | Status: AC | PRN
  Administered 2019-05-21: 7 mL via INTRAVENOUS

## 2019-05-21 NOTE — Progress Notes (Signed)
Spoke to patient on 05/21/19 for a telehealth counseling appointment. Pt stated she received the paperwork regarding engaging in counseling services through [facility] and she was not comfortable signing the Informed Consent document that allows for recording of our sessions. Counseling intern empathized with her concerns and agreed to look into other options. Pt also stated that she may have another option through work that provides for counseling at no cost. Counseling intern will follow-up with patient later this week.  [facility] [place] Counseling Intern Voicemail:  [phone]

## 2019-05-22 ENCOUNTER — Telehealth: Payer: Self-pay | Admitting: *Deleted

## 2019-05-22 DIAGNOSIS — C50412 Malignant neoplasm of upper-outer quadrant of left female breast: Secondary | ICD-10-CM

## 2019-05-22 NOTE — Telephone Encounter (Signed)
Called pt to discuss MRI results and recommendations for PET scan. Received verbal understanding.  PET scan ordered.

## 2019-05-23 ENCOUNTER — Encounter: Payer: Self-pay | Admitting: *Deleted

## 2019-05-25 ENCOUNTER — Telehealth: Payer: Self-pay | Admitting: *Deleted

## 2019-05-25 ENCOUNTER — Other Ambulatory Visit: Payer: Self-pay | Admitting: *Deleted

## 2019-05-25 DIAGNOSIS — C50412 Malignant neoplasm of upper-outer quadrant of left female breast: Secondary | ICD-10-CM

## 2019-05-25 NOTE — Telephone Encounter (Signed)
Pt called requesting referral to microsurgeon for plastic surgery. Referral sent to Dr. Bertrum Sol. Appt scheduled for 1/28 at 11am. Dr. Brantley Stage notified of referral. Confirmed pet scan for 2/1. Encourage pt to call with needs or concerns. Received verbal understanding.

## 2019-05-25 NOTE — Telephone Encounter (Signed)
Referral faxed to [facility] for patient to see Dr. Dewaine Conger - release [id number]

## 2019-05-31 ENCOUNTER — Telehealth: Payer: Self-pay | Admitting: *Deleted

## 2019-05-31 NOTE — Telephone Encounter (Signed)
Left vm for pt informing may have FMLA forms faxed to our office. Fax number provided. Informed pt that referral to Dr. Filomena Jungling was sent and that her office would call with an appt date and time.

## 2019-06-01 ENCOUNTER — Telehealth: Payer: Self-pay | Admitting: *Deleted

## 2019-06-01 NOTE — Telephone Encounter (Signed)
Left vm informing pt that I received her FMLA forms and that they were forwarded to our [facility] specialist.  Informed pt that if she chose to have reconstruction at [facility] or [facility], she would see a breast surgeon to perform the mastectomy. Contact information provided for further questions or needs.

## 2019-06-04 ENCOUNTER — Other Ambulatory Visit: Payer: Self-pay

## 2019-06-04 ENCOUNTER — Encounter (HOSPITAL_COMMUNITY)
Admission: RE | Admit: 2019-06-04 | Discharge: 2019-06-04 | Disposition: A | Discharge status: Home / Self Care | Payer: 59 | Source: Ambulatory Visit | Attending: Hematology and Oncology | Admitting: Hematology and Oncology

## 2019-06-04 ENCOUNTER — Telehealth: Payer: Self-pay | Admitting: Hematology and Oncology

## 2019-06-04 DIAGNOSIS — C50412 Malignant neoplasm of upper-outer quadrant of left female breast: Secondary | ICD-10-CM | POA: Diagnosis present

## 2019-06-04 LAB — GLUCOSE, CAPILLARY: Glucose-Capillary: 104 mg/dL — ABNORMAL HIGH (ref 70–99)

## 2019-06-04 MED ORDER — FLUDEOXYGLUCOSE F - 18 (FDG) INJECTION
7.6000 | Freq: Once | INTRAVENOUS | Status: AC | PRN
  Administered 2019-06-04: 7.6 via INTRAVENOUS

## 2019-06-04 NOTE — Telephone Encounter (Signed)
I informed the patient of the PET CT scan does not show any metastatic disease outside of the breast and the lymph node.  Liver lesions were felt to be benign.

## 2019-06-05 ENCOUNTER — Encounter: Payer: Self-pay | Admitting: *Deleted

## 2019-06-07 ENCOUNTER — Telehealth: Payer: Self-pay | Admitting: *Deleted

## 2019-06-07 NOTE — Telephone Encounter (Signed)
Spoke to pt regarding surgery decision location. Pt has decided to have sx at [facility] with Dr. Morley Kos. Physician team notified. Pt wishes to keep med onc at [facility]. Denies further questions at this time.

## 2019-06-11 ENCOUNTER — Encounter: Payer: Self-pay | Admitting: *Deleted

## 2019-06-13 NOTE — Telephone Encounter (Signed)
Continuous FMLA form faxed to [employer].

## 2019-06-14 ENCOUNTER — Encounter: Payer: Self-pay | Admitting: *Deleted

## 2019-06-28 ENCOUNTER — Encounter: Payer: Self-pay | Admitting: *Deleted

## 2019-07-09 ENCOUNTER — Encounter: Payer: Self-pay | Admitting: *Deleted

## 2019-07-16 ENCOUNTER — Telehealth: Payer: Self-pay | Admitting: *Deleted

## 2019-07-16 NOTE — Telephone Encounter (Signed)
Called pt to assess needs after surgery and to discuss post-op appt with Dr. Lindi Adie. Contact information provided for questions or need. Request return call.

## 2019-07-19 ENCOUNTER — Telehealth: Payer: Self-pay | Admitting: *Deleted

## 2019-07-19 NOTE — Telephone Encounter (Signed)
Per scheduling, Pt does not feel the need to come in doesn't know why she needs to follow up with Dr Lindi Adie .  If appts is needed pt request RN call her to explain   This RN sent msg to pt regarding post-op appt with Dr. Lindi Adie and request return call to schedule appt if she wishes to see Dr. Lindi Adie.

## 2019-10-08 ENCOUNTER — Telehealth: Payer: Self-pay | Admitting: Hematology and Oncology

## 2019-10-08 ENCOUNTER — Encounter: Payer: Self-pay | Admitting: Hematology and Oncology

## 2019-10-08 NOTE — Telephone Encounter (Signed)
Scheduled appt per 6/7 sch message - unable to reach pt. Left message on vmail with appt date and time

## 2019-10-21 NOTE — Progress Notes (Signed)
Travia how are you HEMATOLOGY-ONCOLOGY [facility] VIDEO VISIT PROGRESS NOTE  I connected with Alexa Cook on 10/22/2019 at 11:30 AM EDT by MyChart video conference and verified that I am speaking with the correct person using two identifiers.  I discussed the limitations, risks, security and privacy concerns of performing an evaluation and management service by MyChart and the availability of in person appointments.  I also discussed with the patient that there may be a patient responsible charge related to this service. The patient expressed understanding and agreed to proceed.  Patient's Location: Home Physician Location: Clinic  CHIEF COMPLIANT: Follow-up of recurrent left breast cancer  INTERVAL HISTORY: Alexa Cook is a 49 y.o. female with above-mentioned history of recurrent left breast cancer who underwent mastectomy with reconstruction at [facility] with Dr. Morley Kos. She declined chemotherapy and anti-estrogen therapy. CT CAP on 05/11/19 showed two indeterminate hepatic lesions and an enlarged left axillary lymph node. 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 Pet scan on 06/04/19 showed the 2.0cm left breast lesion and enlarged left axillary lymph node with no evidence of metastatic disease. She presents over MyChart today for follow-up.   Oncology History  Malignant neoplasm of upper-outer quadrant of left female breast ([place])  2015 Initial Diagnosis   Left breast cancer s/p lumpectomy and treatment in [place] in 2015 (Unknown stage) ER/PR Pos, Received 14 days of XRT (stopped by her) Refused anti-estrogens.   04/2019 Relapse/Recurrence   Rash and nodule started Aug 2020, Imaging revealed fat necrosis and a mass extending to the skin , satellite nodule 0.6 cm, Enlarged LN 1.1 cm, Ultrasound 2.5 cm Biopsy revealed Grade 3 IDC, ER 95% positive, PR 100%, Ki-67 15%, HER-2 equivocal by IHC and negative by FISH ratio  1.53     Observations/Objective:  There were no vitals filed for this visit. There is no height or weight on file to calculate BMI.  I have reviewed the data as listed CMP Latest Ref Rng & Units 05/09/2019  Glucose 70 - 99 mg/dL 109(H)  BUN 6 - 20 mg/dL 11  Creatinine 0.44 - 1.00 mg/dL 0.71  Sodium 135 - 145 mmol/L 142  Potassium 3.5 - 5.1 mmol/L 4.2  Chloride 98 - 111 mmol/L 105  CO2 22 - 32 mmol/L 26  Calcium 8.9 - 10.3 mg/dL 9.1  Total Protein 6.5 - 8.1 g/dL 7.6  Total Bilirubin 0.3 - 1.2 mg/dL 0.5  Alkaline Phos 38 - 126 U/L 53  AST 15 - 41 U/L 14(L)  ALT 0 - 44 U/L 14    Lab Results  Component Value Date   WBC 5.2 05/09/2019   HGB 14.8 05/09/2019   HCT 43.8 05/09/2019   MCV 89.6 05/09/2019   PLT 287 05/09/2019   NEUTROABS 2.8 05/09/2019      Assessment Plan:  Malignant neoplasm of upper-outer quadrant of left female breast ([place]) Rash and nodule started Aug 2020, Imaging revealed fat necrosis and a mass extending to the skin , satellite nodule 0.6 cm, Enlarged LN 1.1 cm, Ultrasound 2.5 cm Biopsy revealed Grade 3 IDC, ER 95% positive, PR 100%, Ki-67 15%, HER-2 equivocal by IHC and negative by FISH ratio 1.53  (Left breast cancer s/p lumpectomy and treatment in [place] in 2015 (Unknown stage) ER/PR Pos, Received 14 days of XRT (stopped by her) Refused anti-estrogens.)  PET/CT 06/04/2019: 2 cm left periareolar breast lesion is hypermetabolic.  Single mildly hypermetabolic left axillary lymph node.  No evidence of metastatic  disease.  BREAST, LEFT, MASTECTOMY: At [facility] Invasive ductal carcinoma, Nottingham grade 2, clinically recurrent. Tumor measures 2.5 cm in greatest dimension. Ductal carcinoma in situ, intermediate nuclear grade, cribriform type. Tumor extends to the dermis; the overlying skin is negative for tumor.  Margins negative for carcinoma. Lymphovascular invasion is present.   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   Discussion: Patient is very concerned that only 2 lymph nodes were removed and both were involved.  She is worried that there may be additional lymph nodes which may have cancer in them.  It is for this reason I instructed her to discuss with radiation oncology at [facility] to discuss pros and cons of radiation.  Tamoxifen counseling: I discussed with the patient that tamoxifen is necessary in spite of doing radiation because of trying to prevent distant recurrences.  However patient thinks that if she does radiation she will not take tamoxifen. She will inform me after radiation is complete whether or not she wants to try tamoxifen.   I discussed the assessment and treatment plan with the patient. The patient was provided an opportunity to ask questions and all were answered. The patient agreed with the plan and demonstrated an understanding of the instructions. The patient was advised to call back or seek an in-person evaluation if the symptoms worsen or if the condition fails to improve as anticipated.   I provided 30 minutes of face-to-face MyChart video visit time during this encounter.    Rulon Eisenmenger, MD 10/22/2019   I, Molly Dorshimer, am acting as scribe for Nicholas Lose, MD.  I have reviewed the above documentation for accuracy and completeness, and I agree with the above.

## 2019-10-22 ENCOUNTER — Inpatient Hospital Stay: Payer: 59 | Attending: Hematology and Oncology | Admitting: Hematology and Oncology

## 2019-10-22 DIAGNOSIS — C50412 Malignant neoplasm of upper-outer quadrant of left female breast: Secondary | ICD-10-CM | POA: Diagnosis not present

## 2019-10-22 NOTE — Assessment & Plan Note (Signed)
Rash and nodule started Aug 2020, Imaging revealed fat necrosis and a mass extending to the skin , satellite nodule 0.6 cm, Enlarged LN 1.1 cm, Ultrasound 2.5 cm Biopsy revealed Grade 3 IDC, ER 95% positive, PR 100%, Ki-67 15%, HER-2 equivocal by IHC and negative by FISH ratio 1.53  (Left breast cancer s/p lumpectomy and treatment in [place] in 2015 (Unknown stage) ER/PR Pos, Received 14 days of XRT (stopped by her) Refused anti-estrogens.)  PET/CT 06/04/2019: 2 cm left periareolar breast lesion is hypermetabolic.  Single mildly hypermetabolic left axillary lymph node.  No evidence of metastatic disease.

## 2019-10-29 ENCOUNTER — Telehealth: Payer: Self-pay | Admitting: *Deleted

## 2019-10-29 NOTE — Telephone Encounter (Signed)
"  Alexa Cook, with [name] [phone]).  Trying to assist employer.  Dr. Lindi Adie supported Alexa Cook out of work through 10/24/2019.  She has not yet returned to work  1. When is her next scheduled visit with provider? 1. What is her return to work date?  Requested faxed copy of [employer] form for provider review provided faxed to [phone].

## 2019-10-29 NOTE — Telephone Encounter (Signed)
Thank you for the update Alexa Cook.  Is there anything I need to do on our end or just wait for the paperwork for Dr. Lindi Adie to sign?

## 2020-09-07 IMAGING — PT NM PET TUM IMG INITIAL (PI) SKULL BASE T - THIGH
8 series · 19 of 25 positions shown · non-contrast
Comparison: MRI abdomen 05/21/2019 and CT scan 05/11/2019

CLINICAL DATA: Subsequent treatment strategy for recurrent breast
cancer. Initial diagnosis 3146 with recurrence in 2727. Staging
examination. Indeterminate liver lesions on MRI and CT.

EXAM:
NUCLEAR MEDICINE PET SKULL BASE TO THIGH
TECHNIQUE: 7.6 mCi F-18 FDG was injected intravenously. Full-ring PET imaging
was performed from the skull base to thigh after the radiotracer. CT
data was obtained and used for attenuation correction and anatomic
localization.
Fasting blood glucose: 104 mg/dl

[Series 3: pet sk_thigh ac · axial · 5.0mm · 4.07mm/px · z∈[-966,-46]mm · 3 of 231 slices shown]
[im 1/231]
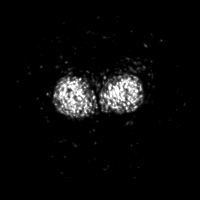
[im 77/231]
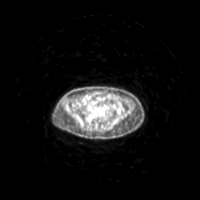
[im 231/231]
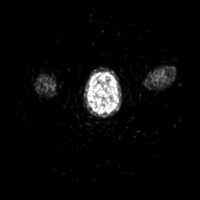

[Series 4: ct sk_thigh 5.0 b31f · axial · 5.0mm · 0.98mm/px · z∈[-966,-46]mm · 4 of 231 slices shown]
[im 1/231]
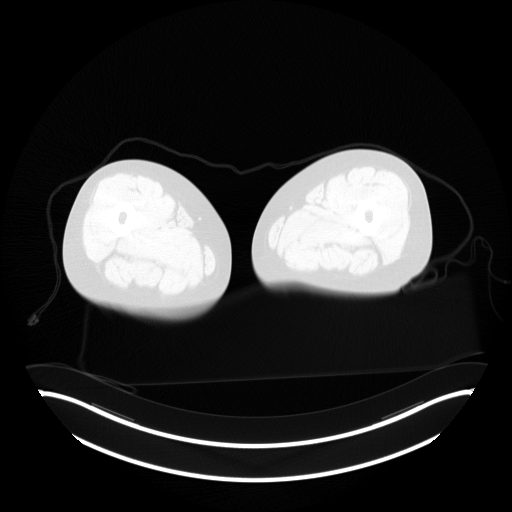
[im 58/231]
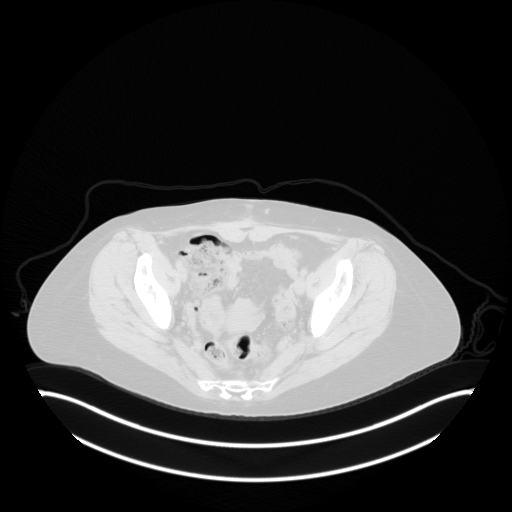
[im 173/231]
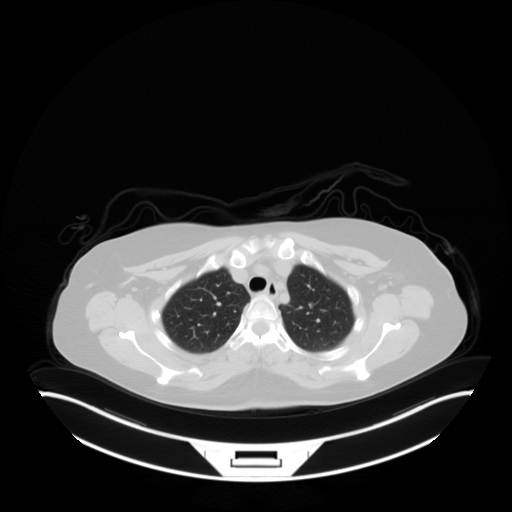
[im 231/231  brain]
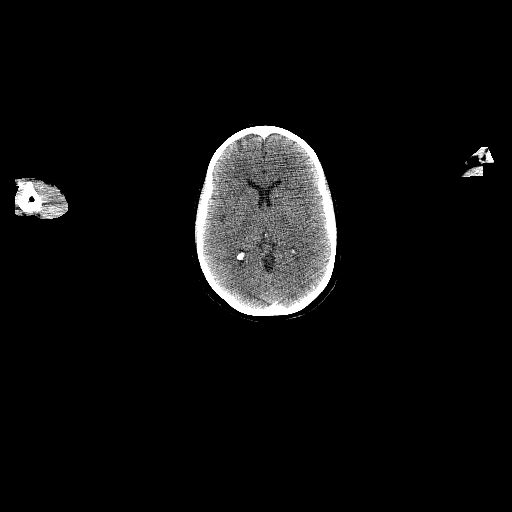

[Series 5: pet sk_thigh nac · axial · 5.0mm · 4.07mm/px · z∈[-966,-46]mm · 4 of 231 slices shown]
[im 1/231]
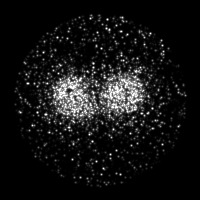
[im 116/231]
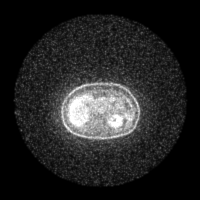
[im 173/231]
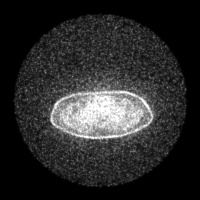
[im 231/231]
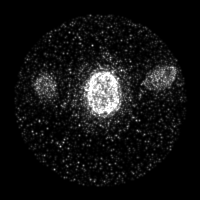

[Series 8: ct sk_thigh 5.0 b70f (id)_bone · axial · 5.0mm · 0.74mm/px · 1 of 68 slices shown]
[im 68/68  bone]
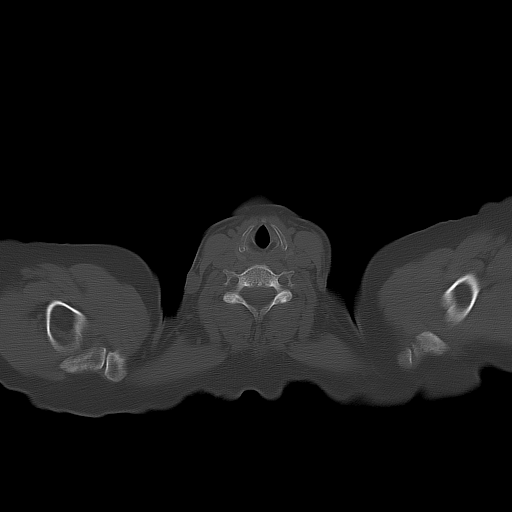

[Series 603: mip range 3 · coronal · 1.91mm/px · 1 of 32 slices shown]
[im 1/32]
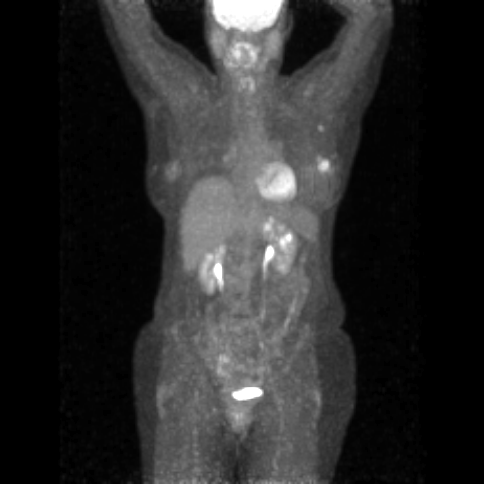

[Series 604: range-ct sk_thigh 5.0 (id)<alpha range> · 1 of 82 slices shown (1 of 2)]
[im 1/82]
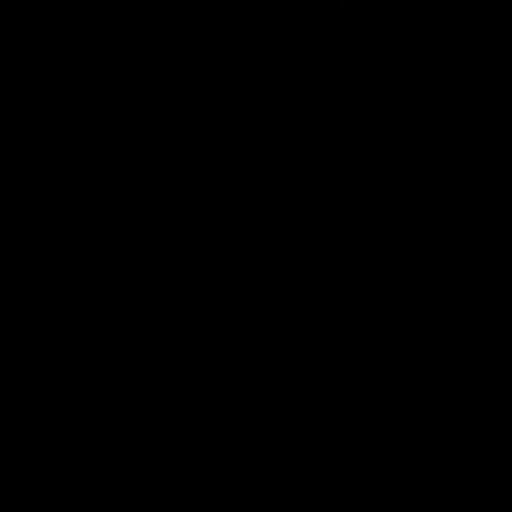

[Series 605: range-ct sk_thigh 5.0 (id)<alpha range> · 4 of 219 slices shown (2 of 2)]
[im 1/219]
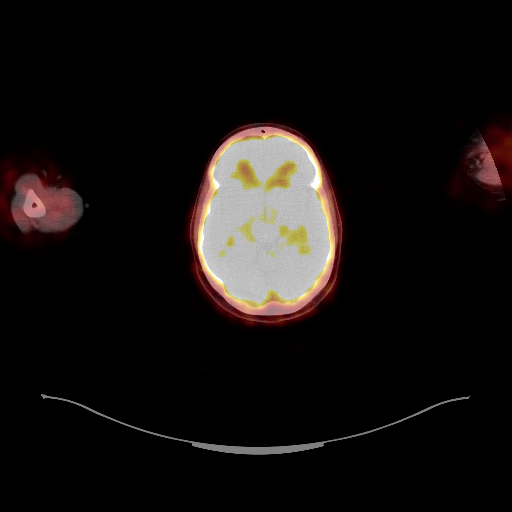
[im 55/219]
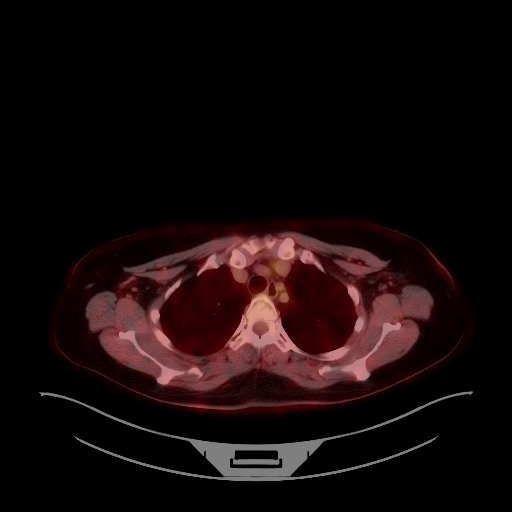
[im 110/219]
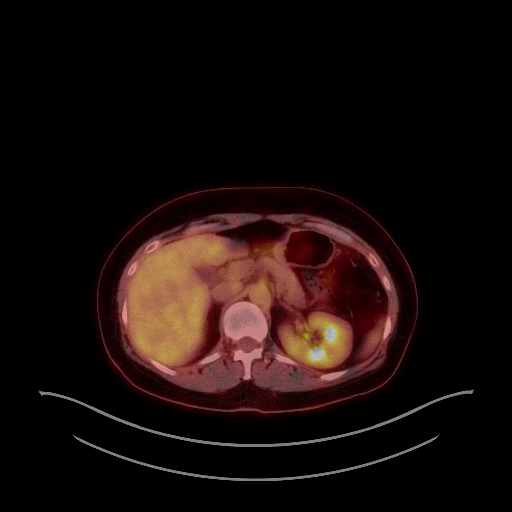
[im 219/219]
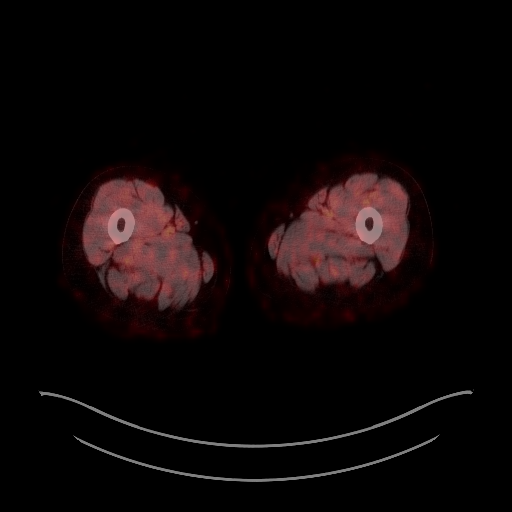

[Series 1056: results mm oncology reading · 1.0mm · 0.50mm/px · 1 of 4 slices shown]
[im 1/4]
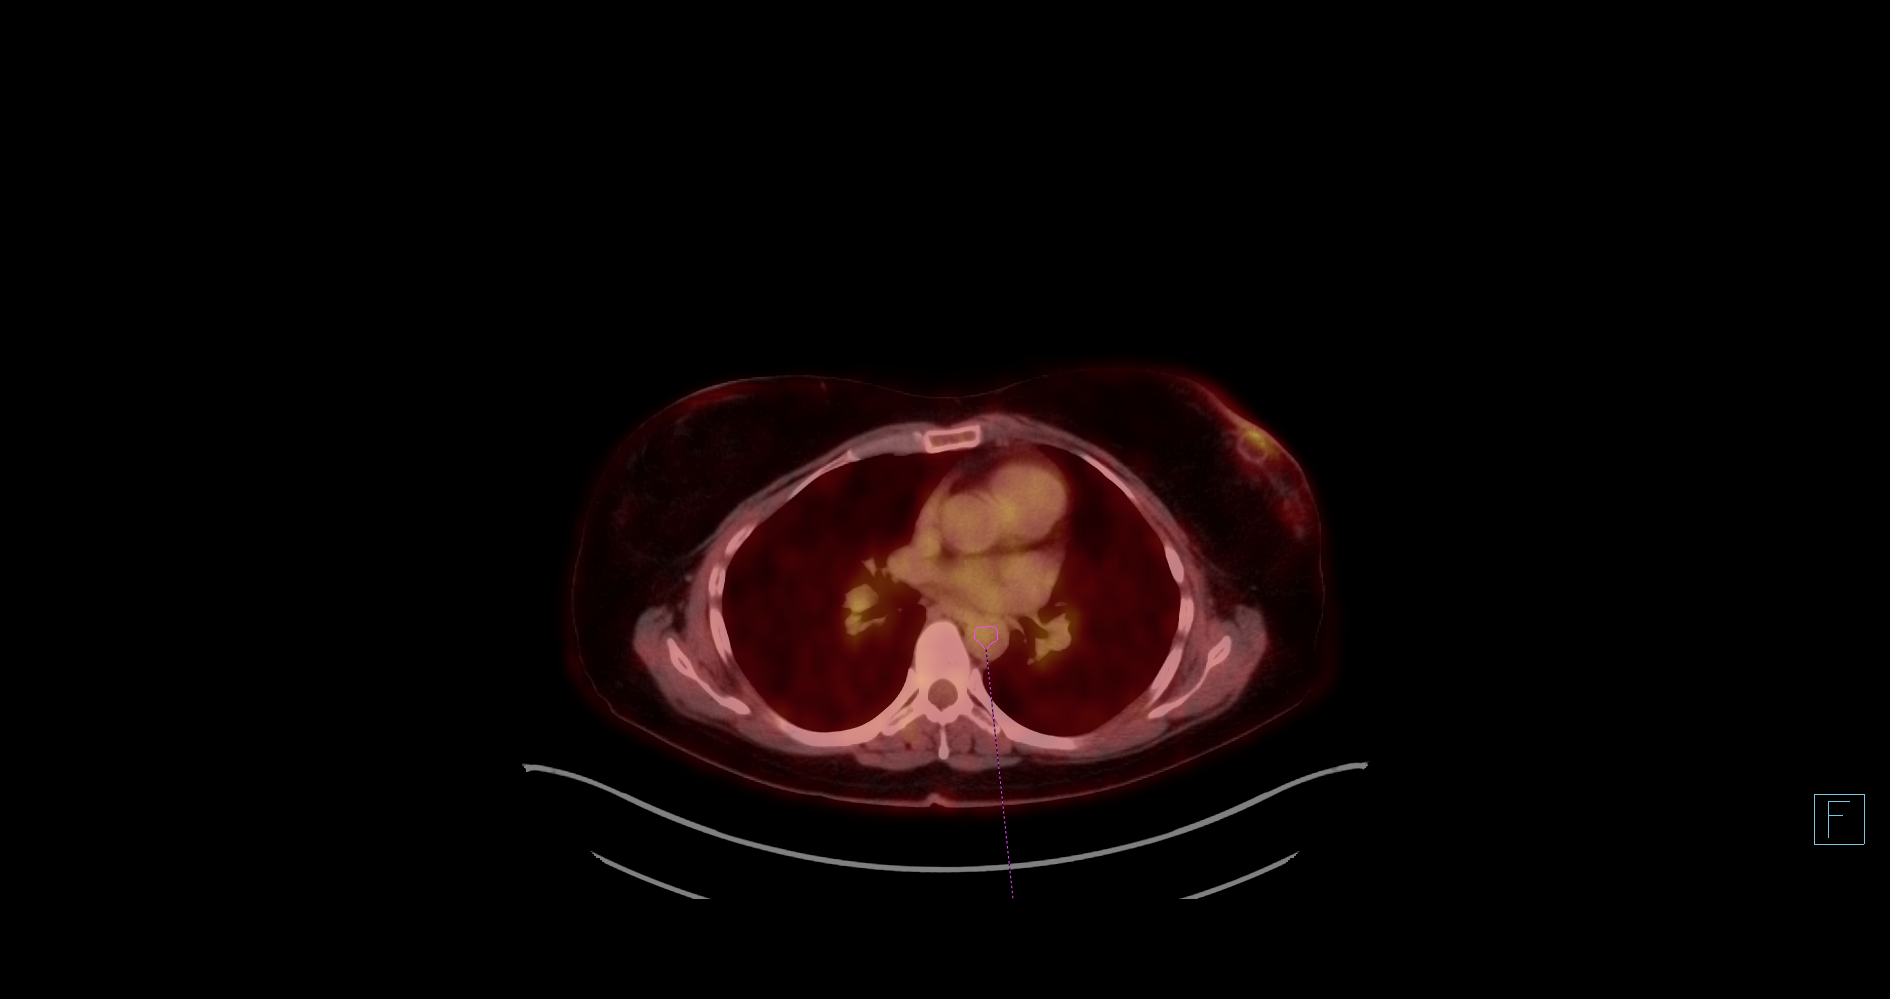

[19 of 25 positions shown; findings below may reference images not displayed]

FINDINGS: Mediastinal blood pool activity: SUV max

Liver activity: SUV max NA

NECK: No hypermetabolic lymph nodes in the neck.

Incidental CT findings: none

CHEST: 2 cm periareolar left breast lesion is hypermetabolic with
SUV max of [REDACTED] mm left axillary lymph node on image 62/4 is mildly
hypermetabolic with SUV max of 3.18.

5.5 mm left subpectoral node on image 64/4 is not hypermetabolic.
SUV max is 1.18.

No right-sided breast lesions.  No supraclavicular adenopathy.

No enlarged or hypermetabolic mediastinal or hilar lymph nodes to
suggest metastatic disease. No worrisome pulmonary nodules to
suggest metastatic disease.

Incidental CT findings: none

ABDOMEN/PELVIS: No hypermetabolic hepatic lesions to suggest
metastatic disease. The liver lesions are likely benign FNH.

No adrenal gland lesions.  No abdominal or pelvic lymphadenopathy.

Incidental CT findings: none

SKELETON: No focal hypermetabolic activity to suggest skeletal
metastasis.

Incidental CT findings: none
IMPRESSION: 1. 2 cm left periareolar breast lesion is hypermetabolic and
consistent with known recurrent breast cancer.
2. Single mildly hypermetabolic left axillary lymph node as above.
3. No findings for metastatic disease involving the lungs,
abdomen/pelvis or bony structures. The liver lesions are likely
benign.
